# Patient Record
Sex: Male | Born: 1995 | Race: White | Hispanic: No | Smoking: Current every day smoker
Health system: Southern US, Community
[De-identification: ages and names within clinical notes are randomized; demographics above are authoritative.]

## PROBLEM LIST (undated history)

## (undated) DIAGNOSIS — J45909 Unspecified asthma, uncomplicated: Secondary | ICD-10-CM

---

## 2020-01-01 ENCOUNTER — Emergency Department (HOSPITAL_COMMUNITY): Payer: Medicaid Other

## 2020-01-01 ENCOUNTER — Emergency Department (HOSPITAL_COMMUNITY)
Admission: EM | Admit: 2020-01-01 | Discharge: 2020-01-01 | Disposition: A | Discharge status: Home / Self Care | Payer: Medicaid Other | Attending: Emergency Medicine | Admitting: Emergency Medicine

## 2020-01-01 ENCOUNTER — Encounter (HOSPITAL_COMMUNITY): Payer: Self-pay | Admitting: Emergency Medicine

## 2020-01-01 ENCOUNTER — Other Ambulatory Visit: Payer: Self-pay

## 2020-01-01 DIAGNOSIS — W2210XA Striking against or struck by unspecified automobile airbag, initial encounter: Secondary | ICD-10-CM | POA: Diagnosis not present

## 2020-01-01 DIAGNOSIS — S0990XA Unspecified injury of head, initial encounter: Secondary | ICD-10-CM | POA: Diagnosis present

## 2020-01-01 DIAGNOSIS — Y998 Other external cause status: Secondary | ICD-10-CM | POA: Diagnosis not present

## 2020-01-01 DIAGNOSIS — S022XXA Fracture of nasal bones, initial encounter for closed fracture: Secondary | ICD-10-CM

## 2020-01-01 DIAGNOSIS — Y929 Unspecified place or not applicable: Secondary | ICD-10-CM | POA: Diagnosis not present

## 2020-01-01 DIAGNOSIS — Y9389 Activity, other specified: Secondary | ICD-10-CM | POA: Diagnosis not present

## 2020-01-01 DIAGNOSIS — F17200 Nicotine dependence, unspecified, uncomplicated: Secondary | ICD-10-CM | POA: Insufficient documentation

## 2020-01-01 DIAGNOSIS — F1210 Cannabis abuse, uncomplicated: Secondary | ICD-10-CM | POA: Insufficient documentation

## 2020-01-01 DIAGNOSIS — S060X1A Concussion with loss of consciousness of 30 minutes or less, initial encounter: Secondary | ICD-10-CM | POA: Insufficient documentation

## 2020-01-01 MED ORDER — NALOXONE HCL 0.4 MG/ML IJ SOLN
INTRAMUSCULAR | Status: AC
  Filled 2020-01-01: qty 1

## 2020-01-01 MED ORDER — NALOXONE HCL 2 MG/2ML IJ SOSY
PREFILLED_SYRINGE | INTRAMUSCULAR | Status: AC
  Filled 2020-01-01: qty 2

## 2020-01-01 MED ORDER — IBUPROFEN 400 MG PO TABS
600.0000 mg | ORAL_TABLET | Freq: Once | ORAL | Status: AC
  Administered 2020-01-01: 600 mg via ORAL
  Filled 2020-01-01: qty 1

## 2020-01-01 MED ORDER — NALOXONE HCL 2 MG/2ML IJ SOSY
1.0000 mg | PREFILLED_SYRINGE | Freq: Once | INTRAMUSCULAR | Status: AC
  Administered 2020-01-01: 1 mg via INTRAVENOUS

## 2020-01-01 MED ORDER — LORAZEPAM 2 MG/ML IJ SOLN
1.0000 mg | Freq: Once | INTRAMUSCULAR | Status: DC
  Filled 2020-01-01: qty 1

## 2020-01-01 MED ORDER — GADOBUTROL 1 MMOL/ML IV SOLN
8.0000 mL | Freq: Once | INTRAVENOUS | Status: AC | PRN
  Administered 2020-01-01: 8 mL via INTRAVENOUS
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
## 2020-01-01 NOTE — ED Notes (Signed)
This RN walked into pts room, pt off the monitor to get ready for MRI. Pt found still, flat on his back, not responsive, and blue on bed. Pulse present. Second RN called to bedside. BVM used to start ventilating pt. Pt hooked to pulse ox. Tech to bedside, MD to beside.

## 2020-01-01 NOTE — ED Notes (Signed)
Pt given 4oz orange juice.
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
## 2020-01-01 NOTE — ED Notes (Signed)
Pt abruptly sitting up, breathing on his own, color improved. Pt asking what happened. Pt admits to "I took a roxy but at like 2:30 this morning". Pts vitals stable now. MD, second RN, tech, and this RN at bedside. AD and AC also at bedside.

## 2020-01-01 NOTE — Discharge Instructions (Addendum)
Use ice every 2-3 hours on nasal bridge for 10 minutes at a time to help with swelling.  Try to avoid blowing her nose or sneezing.  Tylenol and Motrin as needed for pain every 6 hours. Follow-up with ENT specialist for reassessment later this week. Return for new or worsening symptoms.

## 2020-01-01 NOTE — ED Notes (Signed)
Patient in MRI 

## 2020-01-01 NOTE — ED Notes (Signed)
Patient transported to CT 

## 2020-08-17 ENCOUNTER — Emergency Department (HOSPITAL_COMMUNITY): Payer: Medicaid Other

## 2020-08-17 ENCOUNTER — Encounter (HOSPITAL_COMMUNITY): Payer: Self-pay | Admitting: Emergency Medicine

## 2020-08-17 ENCOUNTER — Emergency Department (HOSPITAL_COMMUNITY)
Admission: EM | Admit: 2020-08-17 | Discharge: 2020-08-17 | Disposition: A | Discharge status: Home / Self Care | Payer: Medicaid Other | Attending: Emergency Medicine | Admitting: Emergency Medicine

## 2020-08-17 DIAGNOSIS — F17200 Nicotine dependence, unspecified, uncomplicated: Secondary | ICD-10-CM | POA: Insufficient documentation

## 2020-08-17 DIAGNOSIS — Z720 Tobacco use: Secondary | ICD-10-CM

## 2020-08-17 DIAGNOSIS — F1910 Other psychoactive substance abuse, uncomplicated: Secondary | ICD-10-CM

## 2020-08-17 DIAGNOSIS — R0603 Acute respiratory distress: Secondary | ICD-10-CM | POA: Diagnosis not present

## 2020-08-17 DIAGNOSIS — Z880 Allergy status to penicillin: Secondary | ICD-10-CM | POA: Insufficient documentation

## 2020-08-17 DIAGNOSIS — Z7951 Long term (current) use of inhaled steroids: Secondary | ICD-10-CM | POA: Insufficient documentation

## 2020-08-17 DIAGNOSIS — Z20822 Contact with and (suspected) exposure to covid-19: Secondary | ICD-10-CM | POA: Diagnosis not present

## 2020-08-17 DIAGNOSIS — J45901 Unspecified asthma with (acute) exacerbation: Secondary | ICD-10-CM | POA: Diagnosis not present

## 2020-08-17 DIAGNOSIS — T402X4A Poisoning by other opioids, undetermined, initial encounter: Secondary | ICD-10-CM

## 2020-08-17 DIAGNOSIS — J9600 Acute respiratory failure, unspecified whether with hypoxia or hypercapnia: Secondary | ICD-10-CM

## 2020-08-17 DIAGNOSIS — J4542 Moderate persistent asthma with status asthmaticus: Secondary | ICD-10-CM

## 2020-08-17 HISTORY — DX: Unspecified asthma, uncomplicated: J45.909

## 2020-08-17 LAB — CBC
HCT: 43.4 % (ref 39.0–52.0)
HCT: 48.9 % (ref 39.0–52.0)
Hemoglobin: 15.2 g/dL (ref 13.0–17.0)
Hemoglobin: 15.5 g/dL (ref 13.0–17.0)
MCH: 29.3 pg (ref 26.0–34.0)
MCH: 30.5 pg (ref 26.0–34.0)
MCHC: 31.7 g/dL (ref 30.0–36.0)
MCHC: 35 g/dL (ref 30.0–36.0)
MCV: 87.1 fL (ref 80.0–100.0)
MCV: 92.4 fL (ref 80.0–100.0)
Platelets: 284 10*3/uL (ref 150–400)
Platelets: 292 10*3/uL (ref 150–400)
RBC: 4.98 MIL/uL (ref 4.22–5.81)
RBC: 5.29 MIL/uL (ref 4.22–5.81)
RDW: 12.6 % (ref 11.5–15.5)
RDW: 12.7 % (ref 11.5–15.5)
WBC: 10 10*3/uL (ref 4.0–10.5)
WBC: 16.4 10*3/uL — ABNORMAL HIGH (ref 4.0–10.5)
nRBC: 0 % (ref 0.0–0.2)
nRBC: 0 % (ref 0.0–0.2)

## 2020-08-17 LAB — COMPREHENSIVE METABOLIC PANEL
ALT: 26 U/L (ref 0–44)
AST: 49 U/L — ABNORMAL HIGH (ref 15–41)
Albumin: 3.7 g/dL (ref 3.5–5.0)
Alkaline Phosphatase: 86 U/L (ref 38–126)
Anion gap: 15 (ref 5–15)
BUN: 10 mg/dL (ref 6–20)
CO2: 23 mmol/L (ref 22–32)
Calcium: 9.3 mg/dL (ref 8.9–10.3)
Chloride: 103 mmol/L (ref 98–111)
Creatinine, Ser: 1 mg/dL (ref 0.61–1.24)
GFR, Estimated: >60 mL/min (ref >60)
Glucose, Bld: 136 mg/dL — ABNORMAL HIGH (ref 70–99)
Potassium: 3.8 mmol/L (ref 3.5–5.1)
Sodium: 141 mmol/L (ref 135–145)
Total Bilirubin: 0.8 mg/dL (ref 0.3–1.2)
Total Protein: 6.9 g/dL (ref 6.5–8.1)

## 2020-08-17 LAB — I-STAT CHEM 8, ED
BUN: 11 mg/dL (ref 6–20)
Calcium, Ion: 0.92 mmol/L — ABNORMAL LOW (ref 1.15–1.40)
Chloride: 107 mmol/L (ref 98–111)
Creatinine, Ser: 0.7 mg/dL (ref 0.61–1.24)
Glucose, Bld: 131 mg/dL — ABNORMAL HIGH (ref 70–99)
HCT: 45 % (ref 39.0–52.0)
Hemoglobin: 15.3 g/dL (ref 13.0–17.0)
Potassium: 3.8 mmol/L (ref 3.5–5.1)
Sodium: 137 mmol/L (ref 135–145)
TCO2: 22 mmol/L (ref 22–32)

## 2020-08-17 LAB — SALICYLATE LEVEL: Salicylate Lvl: <7 mg/dL — ABNORMAL LOW (ref 7.0–30.0)

## 2020-08-17 LAB — RAPID URINE DRUG SCREEN, HOSP PERFORMED
Amphetamines: POSITIVE — AB
Barbiturates: NOT DETECTED
Benzodiazepines: POSITIVE — AB
Cocaine: NOT DETECTED
Opiates: NOT DETECTED
Tetrahydrocannabinol: POSITIVE — AB

## 2020-08-17 LAB — BASIC METABOLIC PANEL
Anion gap: 12 (ref 5–15)
BUN: 8 mg/dL (ref 6–20)
CO2: 21 mmol/L — ABNORMAL LOW (ref 22–32)
Calcium: 8.8 mg/dL — ABNORMAL LOW (ref 8.9–10.3)
Chloride: 103 mmol/L (ref 98–111)
Creatinine, Ser: 0.69 mg/dL (ref 0.61–1.24)
GFR, Estimated: >60 mL/min (ref >60)
Glucose, Bld: 246 mg/dL — ABNORMAL HIGH (ref 70–99)
Potassium: 4.3 mmol/L (ref 3.5–5.1)
Sodium: 136 mmol/L (ref 135–145)

## 2020-08-17 LAB — MAGNESIUM: Magnesium: 1.8 mg/dL (ref 1.7–2.4)

## 2020-08-17 LAB — ETHANOL: Alcohol, Ethyl (B): <10 mg/dL (ref <10)

## 2020-08-17 LAB — SARS CORONAVIRUS 2 BY RT PCR (HOSPITAL ORDER, PERFORMED IN ~~LOC~~ HOSPITAL LAB): SARS Coronavirus 2: NEGATIVE

## 2020-08-17 LAB — HIV ANTIBODY (ROUTINE TESTING W REFLEX): HIV Screen 4th Generation wRfx: NONREACTIVE

## 2020-08-17 LAB — POC SARS CORONAVIRUS 2 AG -  ED: SARS Coronavirus 2 Ag: NEGATIVE

## 2020-08-17 MED ORDER — LORAZEPAM 2 MG/ML IJ SOLN: 1.0000 mg | INTRAMUSCULAR | Status: DC | PRN

## 2020-08-17 MED ORDER — IPRATROPIUM-ALBUTEROL 0.5-2.5 (3) MG/3ML IN SOLN
3.0000 mL | Freq: Once | RESPIRATORY_TRACT | Status: AC
  Administered 2020-08-17: 3 mL via RESPIRATORY_TRACT
  Filled 2020-08-17: qty 3

## 2020-08-17 MED ORDER — METHYLPREDNISOLONE SODIUM SUCC 125 MG IJ SOLR
80.0000 mg | Freq: Three times a day (TID) | INTRAMUSCULAR | Status: DC
  Administered 2020-08-17: 80 mg via INTRAVENOUS
  Filled 2020-08-17: qty 2

## 2020-08-17 MED ORDER — ALBUTEROL SULFATE HFA 108 (90 BASE) MCG/ACT IN AERS: 2.0000 | INHALATION_SPRAY | RESPIRATORY_TRACT | Status: DC | PRN

## 2020-08-17 MED ORDER — NALOXONE HCL 2 MG/2ML IJ SOSY
PREFILLED_SYRINGE | INTRAMUSCULAR | Status: AC
  Filled 2020-08-17: qty 2

## 2020-08-17 MED ORDER — ALBUTEROL SULFATE HFA 108 (90 BASE) MCG/ACT IN AERS
2.0000 | INHALATION_SPRAY | RESPIRATORY_TRACT | Status: DC | PRN
  Administered 2020-08-17: 2 via RESPIRATORY_TRACT
  Filled 2020-08-17: qty 6.7

## 2020-08-17 MED ORDER — SODIUM CHLORIDE 0.9 % IV SOLN: INTRAVENOUS | Status: DC

## 2020-08-17 MED ORDER — ONDANSETRON HCL 4 MG/2ML IJ SOLN: 4.0000 mg | Freq: Four times a day (QID) | INTRAMUSCULAR | Status: DC | PRN

## 2020-08-17 MED ORDER — NICOTINE 14 MG/24HR TD PT24
14.0000 mg | MEDICATED_PATCH | Freq: Every day | TRANSDERMAL | Status: DC
  Administered 2020-08-17: 14 mg via TRANSDERMAL
  Filled 2020-08-17: qty 1

## 2020-08-17 MED ORDER — EPINEPHRINE 0.3 MG/0.3ML IJ SOAJ
0.3000 mg | Freq: Once | INTRAMUSCULAR | Status: AC
  Administered 2020-08-17: 0.3 mg via INTRAMUSCULAR
  Filled 2020-08-17: qty 0.3

## 2020-08-17 MED ORDER — LORAZEPAM 2 MG/ML IJ SOLN
0.5000 mg | Freq: Once | INTRAMUSCULAR | Status: AC
  Administered 2020-08-17: 0.5 mg via INTRAVENOUS
  Filled 2020-08-17: qty 1

## 2020-08-17 MED ORDER — ONDANSETRON HCL 4 MG PO TABS: 4.0000 mg | ORAL_TABLET | Freq: Four times a day (QID) | ORAL | Status: DC | PRN

## 2020-08-17 MED ORDER — EPINEPHRINE PF 1 MG/ML IJ SOLN
INTRAMUSCULAR | Status: AC
  Administered 2020-08-17: 0.3 mg via SUBCUTANEOUS
  Filled 2020-08-17: qty 1

## 2020-08-17 MED ORDER — ACETAMINOPHEN 325 MG PO TABS: 650.0000 mg | ORAL_TABLET | Freq: Four times a day (QID) | ORAL | Status: DC | PRN

## 2020-08-17 MED ORDER — ACETAMINOPHEN 650 MG RE SUPP: 650.0000 mg | Freq: Four times a day (QID) | RECTAL | Status: DC | PRN

## 2020-08-17 MED ORDER — LORAZEPAM 2 MG/ML IJ SOLN
INTRAMUSCULAR | Status: AC
  Administered 2020-08-17: 1 mg via INTRAVENOUS
  Filled 2020-08-17: qty 1

## 2020-08-17 NOTE — ED Notes (Signed)
Pt is in the bed thrashing around, saying his whole body is numb. Pt is also saying he is claustrophobic and saying "I can't take this anymore, I'm about to lose my shit." Pt also stated he needed something to sleep and that he just wants to sleep. I verbally notified Dr. Sharolyn Douglas of the pt's status.
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
## 2020-08-17 NOTE — ED Notes (Signed)
Pt keeps taking everything off. Pt stated he wants to leave. Pt is speaking w/dad on phone at this time.

## 2020-08-17 NOTE — ED Notes (Signed)
I was unable to obtain signature for AMA due to signature pad not working. Pt refused last set of VS

## 2020-08-17 NOTE — ED Notes (Signed)
Dr. Sharolyn Douglas paged to [phone] Percival Spanish, RN paged by Marylene Land

## 2020-08-17 NOTE — Discharge Summary (Signed)
Discharge Summary  Todd Clark ZJI:[id number] DOB: January 27, 1996  PCP: Weyman Pedro, PA-C  Admit date: 08/17/2020 Discharge date: 08/17/2020  Time spent: None  Recommendations for Outpatient Follow-up:  1. None- Left AMA  Discharge Diagnoses:  Active Hospital Problems   Diagnosis Date Noted  . Asthma exacerbation 08/17/2020  . Tobacco abuse 08/17/2020    Resolved Hospital Problems  No resolved problems to display.    Discharge Condition: Left AMA  Diet recommendation: None  Vitals:   08/17/20 1015 08/17/20 1123  BP: 115/64 103/69  Pulse: (!) 120 (!) 132  Resp: 20 (!) 22  Temp:    SpO2: 98% 97%    History of present illness:  Todd Clark is a 25 y.o. male with medical history significant for asthma and opiod use disorder who presents by EMS for respiratory distress.  EMS reports that bystanders stated that the patient had been complaining of shortness of breath all day. On EMS arrival, patient was unresponsive and bystanders were performing CPR. EMS noted the patient had positive pulses and the patient was bagged by EMS. Todd Clark became anxious with the bagging and EMS gave him 5 mg of Versed. He was not hypoxic per EMS. He had an albuterol inhaler that someone gave him but it had zero doses remaining per EMS report. He was somnolent when he arrived in ER with GCS of 8 and was given narcan with improvement in his mentation. CXR is negative for infiltrate.  Patient UDS positive for amphetamines, benzodiazepines, marijuana   Today, was notified by the nurse about patient being very restless, likely undergoing withdrawal.  Ativan as needed was ordered to help with withdrawal cautiously as patient was just recently giving Narcan for possible OD.  Patient subsequently left AMA.  Writer/provider did not see patient prior to leaving AGAINST MEDICAL ADVICE    Hospital Course:  Principal Problem:   Asthma exacerbation Active Problems:   Tobacco abuse  Suspected  overdose Polysubstance abuse with possible withdrawal Noted to be alert, awake, oriented, restless UDS positive for amphetamines, benzodiazepines, marijuana S/p Narcan was given in the ER with improvement of mentation Signed AMA  Acute asthma exacerbation Started on Solu-Medrol, nebulizers scheduled and as needed Chest x-ray with no active infection Signed AMA  Tobacco abuse     Estimated body mass index is 24.6 kg/m as calculated from the following:   Height as of 01/01/20: 5\' 11"  (1.803 m).   Weight as of 01/01/20: 80 kg.    Procedures:  None  Consultations:  None  Discharge Exam: BP 103/69   Pulse (!) 132   Temp 98.4 F (36.9 C) (Oral)   Resp (!) 22   SpO2 97%   General: Signed AMA Cardiovascular:  Respiratory:    Discharge Instructions You were cared for by a hospitalist during your hospital stay. If you have any questions about your discharge medications or the care you received while you were in the hospital after you are discharged, you can call the unit and asked to speak with the hospitalist on call if the hospitalist that took care of you is not available. Once you are discharged, your primary care physician will handle any further medical issues. Please note that NO REFILLS for any discharge medications will be authorized once you are discharged, as it is imperative that you return to your primary care physician (or establish a relationship with a primary care physician if you do not have one) for your aftercare needs so that they can reassess your  need for medications and monitor your lab values.   Allergies as of 08/17/2020      Reactions   Amoxicillin-pot Clavulanate Hives, Rash   Penicillins Hives, Rash      Medication List    ASK your doctor about these medications   ADVAIR DISKUS IN Inhale 1 puff into the lungs in the morning and at bedtime.   albuterol 108 (90 Base) MCG/ACT inhaler Commonly known as: VENTOLIN HFA Inhale 2 puffs into the lungs  as needed for wheezing or shortness of breath.      Allergies  Allergen Reactions  . Amoxicillin-Pot Clavulanate Hives and Rash  . Penicillins Hives and Rash      The results of significant diagnostics from this hospitalization (including imaging, microbiology, ancillary and laboratory) are listed below for reference.    Significant Diagnostic Studies: DG Chest Portable 1 View  Result Date: 08/17/2020 CLINICAL DATA:  Shortness of breath EXAM: PORTABLE CHEST 1 VIEW COMPARISON:  None. FINDINGS: The heart size and mediastinal contours are within normal limits. Both lungs are clear. The visualized skeletal structures are unremarkable. IMPRESSION: Normal study. Electronically Signed   By: Charlett Nose M.D.   On: 08/17/2020 00:46    Microbiology: Recent Results (from the past 240 hour(s))  SARS Coronavirus 2 by RT PCR (hospital order, performed in [facility] hospital lab) Nasopharyngeal Nasopharyngeal Swab     Status: None   Collection Time: 08/17/20 12:25 AM   Specimen: Nasopharyngeal Swab  Result Value Ref Range Status   SARS Coronavirus 2 NEGATIVE NEGATIVE Final    Comment: (NOTE) SARS-CoV-2 target nucleic acids are NOT DETECTED.  The SARS-CoV-2 RNA is generally detectable in upper and lower respiratory specimens during the acute phase of infection. The lowest concentration of SARS-CoV-2 viral copies this assay can detect is 250 copies / mL. A negative result does not preclude SARS-CoV-2 infection and should not be used as the sole basis for treatment or other patient management decisions.  A negative result may occur with improper specimen collection / handling, submission of specimen other than nasopharyngeal swab, presence of viral mutation(s) within the areas targeted by this assay, and inadequate number of viral copies (<250 copies / mL). A negative result must be combined with clinical observations, patient history, and epidemiological information.  Fact Sheet for  Patients:   BoilerBrush.com.cy  Fact Sheet for Healthcare Providers: https://pope.com/  This test is not yet approved or  cleared by the [place] FDA and has been authorized for detection and/or diagnosis of SARS-CoV-2 by FDA under an Emergency Use Authorization (EUA).  This EUA will remain in effect (meaning this test can be used) for the duration of the COVID-19 declaration under Section 564(b)(1) of the Act, 21 U.S.C. section 360bbb-3(b)(1), unless the authorization is terminated or revoked sooner.  Performed at [facility], [address], [place], [place] [address]      Labs: Basic Metabolic Panel: Recent Labs  Lab 08/17/20 0033 08/17/20 0040 08/17/20 0825  NA 141 137 136  K 3.8 3.8 4.3  CL 103 107 103  CO2 23  --  21*  GLUCOSE 136* 131* 246*  BUN 10 11 8   CREATININE 1.00 0.70 0.69  CALCIUM 9.3  --  8.8*  MG  --   --  1.8   Liver Function Tests: Recent Labs  Lab 08/17/20 0033  AST 49*  ALT 26  ALKPHOS 86  BILITOT 0.8  PROT 6.9  ALBUMIN 3.7   No results for input(s): LIPASE,  AMYLASE in the last 168 hours. No results for input(s): AMMONIA in the last 168 hours. CBC: Recent Labs  Lab 08/17/20 0033 08/17/20 0040 08/17/20 0825  WBC 16.4*  --  10.0  HGB 15.5 15.3 15.2  HCT 48.9 45.0 43.4  MCV 92.4  --  87.1  PLT 292  --  284   Cardiac Enzymes: No results for input(s): CKTOTAL, CKMB, CKMBINDEX, TROPONINI in the last 168 hours. BNP: BNP (last 3 results) No results for input(s): BNP in the last 8760 hours.  ProBNP (last 3 results) No results for input(s): PROBNP in the last 8760 hours.  CBG: No results for input(s): GLUCAP in the last 168 hours.     Signed:  Briant Cedar, MD [facility] 08/17/2020, 7:03 PM

## 2020-08-17 NOTE — ED Notes (Signed)
Dr. Ezenduka paged to [phone]-per Alana, RN paged by Lynell Kussman 

## 2020-08-17 NOTE — H&P (Signed)
History and Physical    Todd Clark MCN:[id number] DOB: Mar 26, 1996 DOA: 08/17/2020  PCP: Weyman Pedro, PA-C   Patient coming from: [place] encamp[place]  Chief Complaint: SOB  HPI: Todd Clark is a 25 y.o. male with medical history significant for asthma and opiod use disorder who presents by EMS for respiratory distress.  EMS reports that bystanders stated that the patient had been complaining of shortness of breath all day.  On EMS arrival, patient was unresponsive and bystanders were performing CPR.  EMS noted the patient had positive pulses and the patient was bagged by EMS. Todd Clark became anxious with the bagging and EMS gave him 5 mg of Versed. He was not hypoxic per EMS. He had an albuterol inhaler that someone gave him but it had zero doses remaining per EMS report. He was somnolent when he arrived in ER with GCS of 8 and was given narcan with improvement in his mentation. CXR is negative for infiltrate  Review of Systems:  General: Denies fever, chills, weight loss, night sweats.  Denies dizziness.  HENT: Denies head trauma, headache, denies change in hearing, tinnitus.  Denies nasal congestion or bleeding.  Denies sore throat, sores in mouth.  Denies difficulty swallowing Eyes: Denies blurry vision, pain in eye, drainage.  Denies discoloration of eyes. Neck: Denies pain.  Denies swelling.  Denies pain with movement. Cardiovascular: Denies chest pain, palpitations.  Denies edema.  Denies orthopnea Respiratory: Reports shortness of breath with dry cough.  Reports wheezing.  Denies sputum production Gastrointestinal: Denies abdominal pain, swelling.  Denies nausea, vomiting, diarrhea.  Denies melena.  Denies hematemesis. Musculoskeletal: Denies limitation of movement.  Denies deformity or swelling.  Denies pain.  Denies arthralgias or myalgias. Genitourinary: Denies pelvic pain.  Denies urinary frequency or hesitancy.  Denies dysuria.  Skin: Denies rash.  Denies petechiae, purpura,  ecchymosis. Neurological: Denies headache.  Denies syncope.  Denies seizure activity.  Denies weakness or paresthesia.  Denies slurred speech, drooping face.  Denies visual change. Psychiatric: Denies depression, anxiety.  Denies hallucinations.  Past Medical History:  Diagnosis Date  . Asthma     History reviewed. No pertinent surgical history.  Social History  reports that he has been smoking. He has never used smokeless tobacco. He reports current drug use. Drug: Marijuana. He reports that he does not drink alcohol.  Allergies  Allergen Reactions  . Amoxicillin-Pot Clavulanate Hives and Rash  . Penicillins Hives and Rash    History reviewed. No pertinent family history.   Prior to Admission medications   Medication Sig Start Date End Date Taking? Authorizing Provider  albuterol (VENTOLIN HFA) 108 (90 Base) MCG/ACT inhaler Inhale 2 puffs into the lungs as needed for wheezing or shortness of breath. 09/20/13   [provider]    Physical Exam: Vitals:   08/17/20 0200 08/17/20 0215 08/17/20 0230 08/17/20 0315  BP: (!) 130/96 140/90 (!) 148/95 128/77  Pulse: (!) 113 (!) 110 (!) 114 97  Resp: (!) 25 (!) 24 (!) 24 18  SpO2: 96% 96% 96% 97%    Constitutional: NAD, calm, comfortable Vitals:   08/17/20 0200 08/17/20 0215 08/17/20 0230 08/17/20 0315  BP: (!) 130/96 140/90 (!) 148/95 128/77  Pulse: (!) 113 (!) 110 (!) 114 97  Resp: (!) 25 (!) 24 (!) 24 18  SpO2: 96% 96% 96% 97%   General: WDWN, lethargic but opens eyes and responds to questions Eyes: EOMI, PERRL,  conjunctivae normal.  Sclera nonicteric HENT:  [place]/AT, external ears normal.  Nares  patent without epistasis.  Mucous membranes are moist. Posterior pharynx clear of any exudate or lesions.  Neck: Soft, normal range of motion, supple, no masses, no thyromegaly. Trachea midline Respiratory:  Equal breath sounds, diminished in bases. Diffuse expiratory wheezing bilaterally,  no crackles. Normal respiratory  effort. No accessory muscle use.  Cardiovascular: Regular rate and rhythm, no murmurs / rubs / gallops. No extremity edema. 2+ pedal pulses. Abdomen: Soft, no tenderness, nondistended, no rebound or guarding.  No masses palpated. No hepatosplenomegaly. Bowel sounds normoactive Musculoskeletal: FROM. no cyanosis. No joint deformity upper and lower extremities. Normal muscle tone.  Skin: Warm, dry, intact no rashes, lesions, ulcers. No induration. Multiple tattoos Neurologic: CN 2-12 grossly intact.  Normal speech. Sensation intact, Strength 5/5 in all extremities.   Psychiatric:  Normal mood.    Labs on Admission: I have personally reviewed following labs and imaging studies  CBC: Recent Labs  Lab 08/17/20 0033 08/17/20 0040  WBC 16.4*  --   HGB 15.5 15.3  HCT 48.9 45.0  MCV 92.4  --   PLT 292  --     Basic Metabolic Panel: Recent Labs  Lab 08/17/20 0033 08/17/20 0040  NA 141 137  K 3.8 3.8  CL 103 107  CO2 23  --   GLUCOSE 136* 131*  BUN 10 11  CREATININE 1.00 0.70  CALCIUM 9.3  --     GFR: CrCl cannot be calculated (Unknown ideal weight.).  Liver Function Tests: Recent Labs  Lab 08/17/20 0033  AST 49*  ALT 26  ALKPHOS 86  BILITOT 0.8  PROT 6.9  ALBUMIN 3.7    Urine analysis: No results found for: COLORURINE, APPEARANCEUR, LABSPEC, PHURINE, GLUCOSEU, HGBUR, BILIRUBINUR, KETONESUR, PROTEINUR, UROBILINOGEN, NITRITE, LEUKOCYTESUR  Radiological Exams on Admission: DG Chest Portable 1 View  Result Date: 08/17/2020 CLINICAL DATA:  Shortness of breath EXAM: PORTABLE CHEST 1 VIEW COMPARISON:  None. FINDINGS: The heart size and mediastinal contours are within normal limits. Both lungs are clear. The visualized skeletal structures are unremarkable. IMPRESSION: Normal study. Electronically Signed   By: Charlett Nose M.D.   On: 08/17/2020 00:46    EKG: Independently reviewed. EKG shows tachycardia with occasional PVCs.  No acute ST elevation or depression.  QTc  461  Assessment/Plan Principal Problem:   Asthma exacerbation Todd Clark is admitted to med/surg floor.  He was treated with breathing nebulizer therapy, magnesium in ER. Continues to have wheezing Albuterol every 4 hours as needed Solumedrol 80 mg Q 8 hours x 3 doses.  Active Problems:   Tobacco abuse Nicotine patch to prevent nicotine withdrawal while in hospital. Pt advised to quit tobacco use for his health.     Suspected Overdose Mr. Celestin was given narcan in ER with improvement in mentation. UDS ordered and is pending at this time.       DVT prophylaxis: Padua score low. Early ambulation and TED hose for DVT prophylaxis Code Status:   Full code  Family Communication:  Diagnosis and plan discussed with patient.  Further recommendations to follow as clinically indicated Disposition Plan:   Patient is from:  homeless  Anticipated DC to:  To be determined  Anticipated DC date:  Anticipate 2 midnights or more stay in hospital  Anticipated DC barriers: Safe discharge will be determined with assistance of social work  Admission status:  Inpatient   Claudean Severance Jaslen Adcox MD [facility]  How to contact the [facility] Attending or Consulting provider 7A - 7P or covering provider during after  hours 7P -7A, for this patient?   1. Check the care team in [facility] and look for a) attending/consulting [facility] provider listed and b) the [facility] team listed 2. Log into [email or web address] and use [place] universal password to access. If you do not have the password, please contact the hospital operator. 3. Locate the [facility] provider you are looking for under [facility] and page to a number that you can be directly reached. 4. If you still have difficulty reaching the provider, please page the [facility] (Director on Call) for the Hospitalists listed on amion for assistance.  08/17/2020, 3:42 AM

## 2020-08-17 NOTE — ED Triage Notes (Signed)
Pt BIB [facility], per bystanders pt has been complaining of shortness of breath all day. On EMS arrival pt unresponsive, by standers performing CPR, +pulse with EMS. Pt bagged by EMS, and pt became very anxious, given 5mg  versed IM. Given 15mg  albuterol, 2mg  atrovent, 125mg  solumedrol and 2g mag.
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
## 2021-10-29 IMAGING — DX DG CHEST 1V PORT
1 series · 1 of 1 positions shown · non-contrast
Comparison: None.

CLINICAL DATA: Shortness of breath

EXAM:
PORTABLE CHEST 1 VIEW

[chest ap]
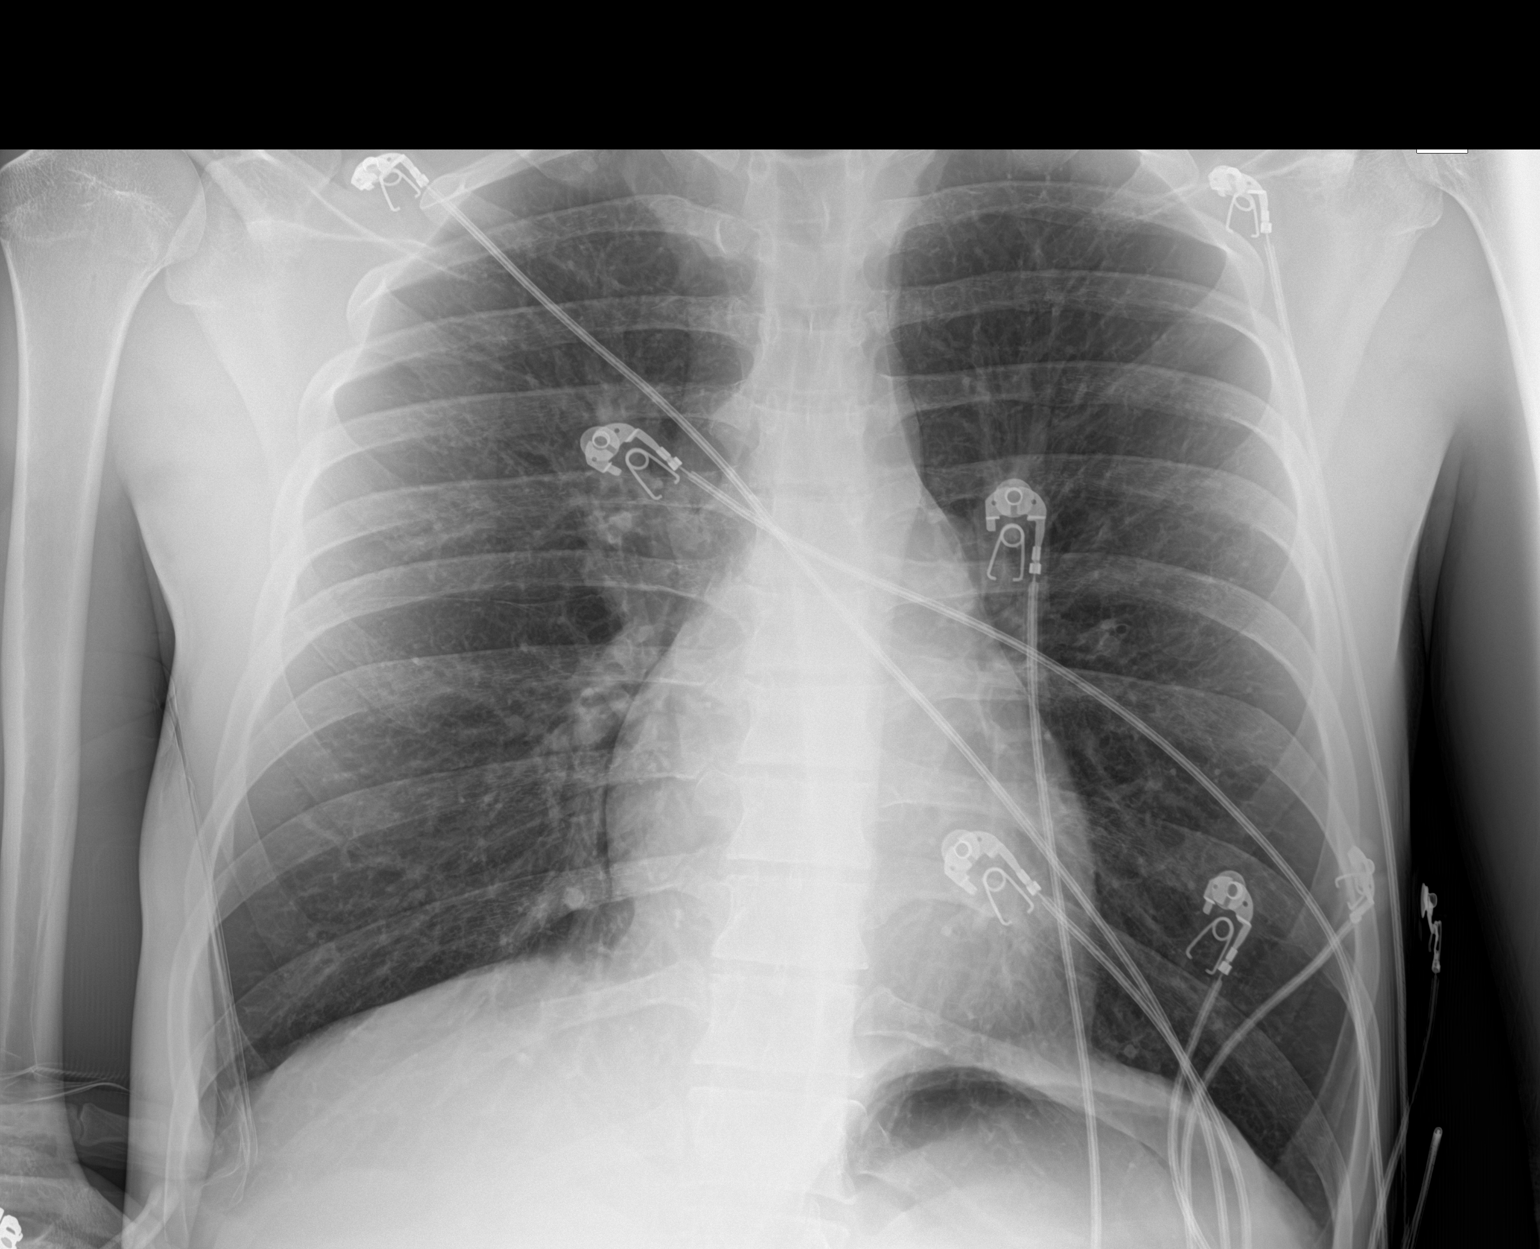

[1 of 1 positions shown; findings below may reference images not displayed]

FINDINGS: The heart size and mediastinal contours are within normal limits.
Both lungs are clear. The visualized skeletal structures are
unremarkable.
IMPRESSION: Normal study.
